# Patient Record
Sex: Female | Born: 1985 | Race: White | Hispanic: No | Marital: Single | Smoking: Never smoker
Health system: Southern US, Community
[De-identification: ages and names within clinical notes are randomized; demographics above are authoritative.]

---

## 2019-04-06 ENCOUNTER — Other Ambulatory Visit: Payer: Self-pay

## 2019-04-06 DIAGNOSIS — Z20822 Contact with and (suspected) exposure to covid-19: Secondary | ICD-10-CM

## 2019-04-07 LAB — NOVEL CORONAVIRUS, NAA: SARS-CoV-2, NAA: NOT DETECTED

## 2019-04-08 ENCOUNTER — Telehealth: Payer: Self-pay | Admitting: General Practice

## 2019-04-08 NOTE — Telephone Encounter (Signed)
Negative COVID results given. Patient results "NOT Detected." Caller expressed understanding. ° °

## 2019-04-20 ENCOUNTER — Other Ambulatory Visit: Payer: Self-pay

## 2019-04-20 DIAGNOSIS — Z20822 Contact with and (suspected) exposure to covid-19: Secondary | ICD-10-CM

## 2019-04-21 LAB — NOVEL CORONAVIRUS, NAA: SARS-CoV-2, NAA: NOT DETECTED

## 2019-04-22 ENCOUNTER — Telehealth: Payer: Self-pay | Admitting: General Practice

## 2019-04-22 NOTE — Telephone Encounter (Signed)
Negative COVID results given. Patient results "NOT Detected." Caller expressed understanding. ° °

## 2019-08-09 ENCOUNTER — Ambulatory Visit: Payer: Self-pay | Attending: Internal Medicine

## 2019-08-09 DIAGNOSIS — Z20822 Contact with and (suspected) exposure to covid-19: Secondary | ICD-10-CM

## 2019-08-11 LAB — NOVEL CORONAVIRUS, NAA: SARS-CoV-2, NAA: NOT DETECTED

## 2019-08-12 ENCOUNTER — Telehealth: Payer: Self-pay | Admitting: General Practice

## 2019-08-12 NOTE — Telephone Encounter (Signed)
Patient is calling for her negative COVID test results. Patient expressed understanding. °

## 2019-10-11 ENCOUNTER — Other Ambulatory Visit: Payer: Self-pay

## 2019-10-11 ENCOUNTER — Emergency Department: Payer: Self-pay

## 2019-10-11 ENCOUNTER — Emergency Department
Admission: EM | Admit: 2019-10-11 | Discharge: 2019-10-11 | Disposition: A | Discharge status: Home / Self Care | Payer: Self-pay | Attending: Emergency Medicine | Admitting: Emergency Medicine

## 2019-10-11 DIAGNOSIS — M7918 Myalgia, other site: Secondary | ICD-10-CM | POA: Insufficient documentation

## 2019-10-11 DIAGNOSIS — B349 Viral infection, unspecified: Secondary | ICD-10-CM | POA: Insufficient documentation

## 2019-10-11 DIAGNOSIS — H9203 Otalgia, bilateral: Secondary | ICD-10-CM | POA: Insufficient documentation

## 2019-10-11 DIAGNOSIS — R5383 Other fatigue: Secondary | ICD-10-CM | POA: Insufficient documentation

## 2019-10-11 DIAGNOSIS — R0981 Nasal congestion: Secondary | ICD-10-CM | POA: Insufficient documentation

## 2019-10-11 DIAGNOSIS — R1110 Vomiting, unspecified: Secondary | ICD-10-CM | POA: Insufficient documentation

## 2019-10-11 DIAGNOSIS — Z20822 Contact with and (suspected) exposure to covid-19: Secondary | ICD-10-CM | POA: Insufficient documentation

## 2019-10-11 DIAGNOSIS — R197 Diarrhea, unspecified: Secondary | ICD-10-CM | POA: Insufficient documentation

## 2019-10-11 LAB — SARS CORONAVIRUS 2 (TAT 6-24 HRS): SARS Coronavirus 2: NEGATIVE

## 2019-10-11 LAB — GROUP A STREP BY PCR: Group A Strep by PCR: NOT DETECTED

## 2019-10-11 MED ORDER — ONDANSETRON HCL 8 MG PO TABS: 8.0000 mg | ORAL_TABLET | Freq: Three times a day (TID) | ORAL | 0 refills | Status: DC | PRN

## 2019-10-11 MED ORDER — BENZONATATE 100 MG PO CAPS: 200.0000 mg | ORAL_CAPSULE | Freq: Three times a day (TID) | ORAL | 0 refills | Status: DC | PRN

## 2019-10-11 MED ORDER — DIPHENOXYLATE-ATROPINE 2.5-0.025 MG PO TABS
2.0000 | ORAL_TABLET | Freq: Once | ORAL | Status: AC
  Administered 2019-10-11: 2 via ORAL
  Filled 2019-10-11: qty 2

## 2019-10-11 MED ORDER — LOPERAMIDE HCL 2 MG PO TABS: 2.0000 mg | ORAL_TABLET | Freq: Four times a day (QID) | ORAL | 0 refills | Status: DC | PRN

## 2019-10-11 MED ORDER — ONDANSETRON 8 MG PO TBDP
8.0000 mg | ORAL_TABLET | Freq: Once | ORAL | Status: AC
  Administered 2019-10-11: 8 mg via ORAL
  Filled 2019-10-11: qty 2

## 2019-10-11 MED ORDER — IBUPROFEN 600 MG PO TABS: 600.0000 mg | ORAL_TABLET | Freq: Three times a day (TID) | ORAL | 0 refills | Status: DC | PRN

## 2019-10-11 MED ORDER — FEXOFENADINE-PSEUDOEPHED ER 60-120 MG PO TB12: 1.0000 | ORAL_TABLET | Freq: Two times a day (BID) | ORAL | 0 refills | Status: DC

## 2019-10-11 NOTE — Discharge Instructions (Signed)
Follow discharge care instruction take medication as directed.  Advised self quarantine pending results of COVID-19 test. 

## 2019-10-11 NOTE — ED Notes (Signed)
See triage note  Presents with 2-3 day hx of cough,body aches and n/v/d  Denies any fever  Last time having any diarrhea or vomiting was this am   Also has sore throat

## 2019-10-11 NOTE — ED Provider Notes (Signed)
[facility] Emergency Department Provider Note   ____________________________________________   First MD Initiated Contact with Patient 10/11/19 [phone]     (approximate)  I have reviewed the triage vital signs and the nursing notes.   HISTORY  Chief Complaint URI    HPI Amy Steele is a 34 y.o. female patient presents with 3 days of cough, nasal congestion, earache, sore throat, vomiting, and diarrhea.  Patient also complained of body aches and fatigue.  Patient denies recent travel or known contact with COVID-19.  Patient has tested negative for COVID-19 3 times in the past 6 months.      History reviewed. No pertinent past medical history.  There are no problems to display for this patient.   History reviewed. No pertinent surgical history.  Prior to Admission medications   Medication Sig Start Date End Date Taking? Authorizing Provider  benzonatate (TESSALON PERLES) 100 MG capsule Take 2 capsules (200 mg total) by mouth 3 (three) times daily as needed. 10/11/19 10/10/20  Joni Reining, PA-C  fexofenadine-pseudoephedrine (ALLEGRA-D) 60-120 MG 12 hr tablet Take 1 tablet by mouth 2 (two) times daily. 10/11/19   Joni Reining, PA-C  ibuprofen (ADVIL) 600 MG tablet Take 1 tablet (600 mg total) by mouth every 8 (eight) hours as needed. 10/11/19   Joni Reining, PA-C  loperamide (IMODIUM A-D) 2 MG tablet Take 1 tablet (2 mg total) by mouth 4 (four) times daily as needed for diarrhea or loose stools. 10/11/19   Joni Reining, PA-C  ondansetron (ZOFRAN) 8 MG tablet Take 1 tablet (8 mg total) by mouth every 8 (eight) hours as needed for nausea or vomiting. 10/11/19   Joni Reining, PA-C    Allergies Patient has no known allergies.  No family history on file.  Social History Social History   Tobacco Use  . Smoking status: Never Smoker  . Smokeless tobacco: Never Used  Substance Use Topics  . Alcohol use: Not Currently  . Drug use: Not Currently     Review of Systems Constitutional: No fever/chills.  Body ache and fatigue. Eyes: No visual changes. ENT: Sore throat, nasal congestion, bilateral ear pressure/pain. Cardiovascular: Denies chest pain.  Nonproductive cough. Respiratory: Denies shortness of breath. Gastrointestinal: No abdominal pain.  Nausea, vomiting, diarrhea.  Genitourinary: Negative for dysuria. Musculoskeletal: Negative for back pain. Skin: Negative for rash. Neurological: Negative for headaches, focal weakness or numbness.  ____________________________________________   PHYSICAL EXAM:  VITAL SIGNS: ED Triage Vitals  Enc Vitals Group     BP 10/11/19 0933 (!) 149/88     Pulse Rate 10/11/19 0933 88     Resp 10/11/19 0933 17     Temp 10/11/19 0933 (!) 97.5 F (36.4 C)     Temp Source 10/11/19 0933 Oral     SpO2 10/11/19 0933 98 %     Weight 10/11/19 0930 (!) 303 lb (137.4 kg)     Height 10/11/19 0930 5\' 4"  (1.626 m)     Head Circumference --      Peak Flow --      Pain Score 10/11/19 0930 5     Pain Loc --      Pain Edu? --      Excl. in GC? --    Constitutional: Alert and oriented. Well appearing and in no acute distress. Nose: Tenderness nasal turbinates clear rhinorrhea. Mouth/Throat: Mucous membranes are moist.  Oropharynx non-erythematous.  Postnasal drainage. Neck: No stridor.  Hematological/Lymphatic/Immunilogical: No cervical lymphadenopathy. Cardiovascular: Normal  rate, regular rhythm. Grossly normal heart sounds.  Good peripheral circulation. Respiratory: Normal respiratory effort.  No retractions. Lungs CTAB. Gastrointestinal: Soft and nontender. No distention. No abdominal bruits. No CVA tenderness. Genitourinary: Deferred Musculoskeletal: No lower extremity tenderness nor edema.  No joint effusions. Neurologic:  Normal speech and language. No gross focal neurologic deficits are appreciated. No gait instability. Skin:  Skin is warm, dry and intact. No rash noted. Psychiatric: Mood and  affect are normal. Speech and behavior are normal.  ____________________________________________   LABS (all labs ordered are listed, but only abnormal results are displayed)  Labs Reviewed  GROUP A STREP BY PCR  SARS CORONAVIRUS 2 (TAT 6-24 HRS)   ____________________________________________  EKG   ____________________________________________  RADIOLOGY  ED MD interpretation:    Official radiology report(s): DG Chest Portable 1 View  Result Date: 10/11/2019 CLINICAL DATA:  Cough and congestion for 3 days EXAM: PORTABLE CHEST 1 VIEW COMPARISON:  None. FINDINGS: The heart size and mediastinal contours are within normal limits. Both lungs are clear. The visualized skeletal structures are unremarkable. IMPRESSION: No active disease. Electronically Signed   By: Randa Ngo M.D.   On: 10/11/2019 10:32    ____________________________________________   PROCEDURES  Procedure(s) performed (including Critical Care):  Procedures   ____________________________________________   INITIAL IMPRESSION / ASSESSMENT AND PLAN / ED COURSE  As part of my medical decision making, I reviewed the following data within the [facility]   Patient presents with 3 days of cough, nasal congestion, bilateral ear pain, sore throat, body aches, and fatigue.  Discussed negative strep results with patient.  Patient physical exam and complaint consistent with viral illness.  Patient given discharge care instruction advised self quarantine pending results of COVID-19 test.  Take medication as directed.  Advised [facility] with [facility].       Amy Steele was evaluated in Emergency Department on 10/11/2019 for the symptoms described in the history of present illness. She was evaluated in the context of the global COVID-19 pandemic, which necessitated consideration that the patient might be at risk for infection with the SARS-CoV-2 virus that causes COVID-19. Institutional  protocols and algorithms that pertain to the evaluation of patients at risk for COVID-19 are in a state of rapid change based on information released by regulatory bodies including the CDC and federal and state organizations. These policies and algorithms were followed during the patient's care in the ED.       ____________________________________________   FINAL CLINICAL IMPRESSION(S) / ED DIAGNOSES  Final diagnoses:  Viral illness     ED Discharge Orders         Ordered    fexofenadine-pseudoephedrine (ALLEGRA-D) 60-120 MG 12 hr tablet  2 times daily     10/11/19 1137    benzonatate (TESSALON PERLES) 100 MG capsule  3 times daily PRN     10/11/19 1137    ibuprofen (ADVIL) 600 MG tablet  Every 8 hours PRN     10/11/19 1137    ondansetron (ZOFRAN) 8 MG tablet  Every 8 hours PRN     10/11/19 1137    loperamide (IMODIUM A-D) 2 MG tablet  4 times daily PRN     10/11/19 1137           Note:  This document was prepared using Dragon voice recognition software and may include unintentional dictation errors.    Sable Feil, PA-C 10/11/19 1142    Lavonia Drafts, MD 10/11/19 1425

## 2019-10-11 NOTE — ED Triage Notes (Signed)
Pt c/o cough, congestion ear ache with sore throat since Sunday. Pt is in NAD,

## 2019-10-27 ENCOUNTER — Emergency Department
Admission: EM | Admit: 2019-10-27 | Discharge: 2019-10-27 | Disposition: A | Discharge status: Home / Self Care | Payer: Self-pay | Attending: Emergency Medicine | Admitting: Emergency Medicine

## 2019-10-27 ENCOUNTER — Other Ambulatory Visit: Payer: Self-pay

## 2019-10-27 DIAGNOSIS — J069 Acute upper respiratory infection, unspecified: Secondary | ICD-10-CM | POA: Insufficient documentation

## 2019-10-27 MED ORDER — PSEUDOEPH-BROMPHEN-DM 30-2-10 MG/5ML PO SYRP: 5.0000 mL | ORAL_SOLUTION | Freq: Four times a day (QID) | ORAL | 0 refills | Status: AC | PRN

## 2019-10-27 NOTE — ED Triage Notes (Signed)
Pt c/o cough with sore throat and congestion for the past week. Pt is in NAD at present. Pt ambulatory with a steady gait

## 2019-10-27 NOTE — ED Notes (Signed)
See triage note states she was seen 2 weeks ago with similar sxs'  Had negative COVID test at that time   States she is now having scratchy throat and right ear pain with some congestion fro about 1 week  States she has been having low grade temp at home but is afebrile on arrival to ED

## 2019-10-27 NOTE — Discharge Instructions (Signed)
Follow-up with your primary care provider if any continued problems or concerns.  Begin taking Bromfed DM as needed for congestion, scratchy throat, ear pain.  You may also take Tylenol or ibuprofen with this medication as they will not interfere with each other.  Drink lots of fluids with this medication.

## 2019-10-27 NOTE — ED Provider Notes (Signed)
[facility] Emergency Department Provider Note   ____________________________________________   First MD Initiated Contact with Patient 10/27/19 1358     (approximate)  I have reviewed the triage vital signs and the nursing notes.   HISTORY  Chief Complaint URI   HPI Amy Steele is a 34 y.o. female presents to the ED with complaint of scratchy throat, right ear pain, nasal congestion for 1 week.  Patient denies any known exposure to Covid and has had subjective "low-grade temp".  Patient denies any loss of taste or smell.  Patient was seen approximately 2 weeks ago and had a Covid test done in the ED at that time which was negative.  She has been taking over-the-counter medications but none which would have helped with nasal congestion or posterior drainage.  She rates her pain as 5 out of 10.     History reviewed. No pertinent past medical history.  There are no problems to display for this patient.   History reviewed. No pertinent surgical history.  Prior to Admission medications   Medication Sig Start Date End Date Taking? Authorizing Provider  brompheniramine-pseudoephedrine-DM 30-2-10 MG/5ML syrup Take 5 mLs by mouth 4 (four) times daily as needed. 10/27/19   Tommi Rumps, PA-C    Allergies Patient has no known allergies.  No family history on file.  Social History Social History   Tobacco Use  . Smoking status: Never Smoker  . Smokeless tobacco: Never Used  Substance Use Topics  . Alcohol use: Not Currently  . Drug use: Not Currently    Review of Systems Constitutional: No fever/chills Eyes: No visual changes. ENT: Positive nasal congestion, positive scratchy throat and right ear pain. Cardiovascular: Denies chest pain. Respiratory: Denies shortness of breath. Gastrointestinal: No abdominal pain.  No nausea, no vomiting.  No diarrhea.  Genitourinary: Negative for dysuria. Musculoskeletal: Negative for back pain. Skin:  Negative for rash. Neurological: Negative for headaches, focal weakness or numbness. ____________________________________________   PHYSICAL EXAM:  VITAL SIGNS: ED Triage Vitals  Enc Vitals Group     BP 10/27/19 1310 (!) 146/95     Pulse Rate 10/27/19 1310 92     Resp 10/27/19 1310 18     Temp 10/27/19 1310 98.2 F (36.8 C)     Temp Source 10/27/19 1310 Oral     SpO2 10/27/19 1310 98 %     Weight 10/27/19 1308 (!) 303 lb (137.4 kg)     Height 10/27/19 1308 5\' 4"  (1.626 m)     Head Circumference --      Peak Flow --      Pain Score 10/27/19 1308 5     Pain Loc --      Pain Edu? --      Excl. in GC? --    Constitutional: Alert and oriented. Well appearing and in no acute distress. Eyes: Conjunctivae are normal.  Head: Atraumatic. Nose: Mild congestion/rhinnorhea.  EACs and TMs clear bilaterally. Mouth/Throat: Mucous membranes are moist.  Oropharynx non-erythematous.  Mild posterior drainage noted but no exudate or erythema noted. Neck: No stridor.   Hematological/Lymphatic/Immunilogical: No cervical lymphadenopathy. Cardiovascular: Normal rate, regular rhythm. Grossly normal heart sounds.  Good peripheral circulation. Respiratory: Normal respiratory effort.  No retractions. Lungs CTAB. Musculoskeletal: Moves upper and lower extremities they have difficulty normal gait was noted. Neurologic:  Normal speech and language. No gross focal neurologic deficits are appreciated. No gait instability. Skin:  Skin is warm, dry and intact. No rash noted. Psychiatric: Mood  and affect are normal. Speech and behavior are normal.  ____________________________________________   LABS (all labs ordered are listed, but only abnormal results are displayed)  Labs Reviewed - No data to display ____________________________________________  PROCEDURES  Procedure(s) performed (including Critical Care):  Procedures   ____________________________________________   INITIAL IMPRESSION /  ASSESSMENT AND PLAN / ED COURSE  As part of my medical decision making, I reviewed the following data within the electronic MEDICAL RECORD NUMBER Notes from prior ED visits and [place] Controlled Substance Database  34 year old female presents to the ED with complaint of nasal congestion, scratchy throat and right ear pain for approximately 1 week.  She also complains of a subjective fever but is afebrile in the ED and O2 sat is 98%.  She was tested 2 weeks ago and Covid test was negative.  She states that the symptoms she has today are similar to the symptoms that she was seen before.  She has been taking over-the-counter medication but nothing for the congestion.  A prescription for Bromfed was sent to her pharmacy and patient was encouraged to drink lots of fluids.  ____________________________________________   FINAL CLINICAL IMPRESSION(S) / ED DIAGNOSES  Final diagnoses:  Upper respiratory tract infection, unspecified type     ED Discharge Orders         Ordered    brompheniramine-pseudoephedrine-DM 30-2-10 MG/5ML syrup  4 times daily PRN     10/27/19 1410           Note:  This document was prepared using Dragon voice recognition software and may include unintentional dictation errors.    Johnn Hai, PA-C 10/27/19 1411    Duffy Bruce, MD 10/28/19 1004

## 2020-02-15 ENCOUNTER — Telehealth: Payer: Self-pay | Admitting: General Practice

## 2020-02-15 NOTE — Telephone Encounter (Signed)
Individual has been contacted 3+ times regarding ED referral. No further attempts to contact individual will be made. 

## 2020-10-09 ENCOUNTER — Other Ambulatory Visit: Payer: Self-pay

## 2020-10-09 ENCOUNTER — Emergency Department
Admission: EM | Admit: 2020-10-09 | Discharge: 2020-10-09 | Disposition: A | Discharge status: Home / Self Care | Payer: Self-pay | Attending: Emergency Medicine | Admitting: Emergency Medicine

## 2020-10-09 ENCOUNTER — Emergency Department: Payer: Self-pay

## 2020-10-09 DIAGNOSIS — R0602 Shortness of breath: Secondary | ICD-10-CM | POA: Insufficient documentation

## 2020-10-09 DIAGNOSIS — B349 Viral infection, unspecified: Secondary | ICD-10-CM | POA: Insufficient documentation

## 2020-10-09 DIAGNOSIS — J209 Acute bronchitis, unspecified: Secondary | ICD-10-CM | POA: Insufficient documentation

## 2020-10-09 LAB — COMPREHENSIVE METABOLIC PANEL
ALT: 13 U/L (ref 0–44)
AST: 16 U/L (ref 15–41)
Albumin: 3.7 g/dL (ref 3.5–5.0)
Alkaline Phosphatase: 63 U/L (ref 38–126)
Anion gap: 7 (ref 5–15)
BUN: 12 mg/dL (ref 6–20)
CO2: 24 mmol/L (ref 22–32)
Calcium: 8.5 mg/dL — ABNORMAL LOW (ref 8.9–10.3)
Chloride: 107 mmol/L (ref 98–111)
Creatinine, Ser: 0.92 mg/dL (ref 0.44–1.00)
GFR, Estimated: >60 mL/min (ref >60)
Glucose, Bld: 142 mg/dL — ABNORMAL HIGH (ref 70–99)
Potassium: 4.4 mmol/L (ref 3.5–5.1)
Sodium: 138 mmol/L (ref 135–145)
Total Bilirubin: 0.5 mg/dL (ref 0.3–1.2)
Total Protein: 7.9 g/dL (ref 6.5–8.1)

## 2020-10-09 LAB — URINALYSIS, COMPLETE (UACMP) WITH MICROSCOPIC
Bacteria, UA: NONE SEEN
Bilirubin Urine: NEGATIVE
Glucose, UA: NEGATIVE mg/dL
Hgb urine dipstick: NEGATIVE
Ketones, ur: NEGATIVE mg/dL
Leukocytes,Ua: NEGATIVE
Nitrite: NEGATIVE
Protein, ur: NEGATIVE mg/dL
Specific Gravity, Urine: 1.017 (ref 1.005–1.030)
pH: 6 (ref 5.0–8.0)

## 2020-10-09 LAB — CBC
HCT: 36.8 % (ref 36.0–46.0)
Hemoglobin: 10.5 g/dL — ABNORMAL LOW (ref 12.0–15.0)
MCH: 23.4 pg — ABNORMAL LOW (ref 26.0–34.0)
MCHC: 28.5 g/dL — ABNORMAL LOW (ref 30.0–36.0)
MCV: 82.1 fL (ref 80.0–100.0)
Platelets: 285 10*3/uL (ref 150–400)
RBC: 4.48 MIL/uL (ref 3.87–5.11)
RDW: 15.7 % — ABNORMAL HIGH (ref 11.5–15.5)
WBC: 8.3 10*3/uL (ref 4.0–10.5)
nRBC: 0 % (ref 0.0–0.2)

## 2020-10-09 LAB — LIPASE, BLOOD: Lipase: 30 U/L (ref 11–51)

## 2020-10-09 LAB — PREGNANCY, URINE: Preg Test, Ur: NEGATIVE

## 2020-10-09 LAB — TROPONIN I (HIGH SENSITIVITY): Troponin I (High Sensitivity): 3 ng/L (ref <18)

## 2020-10-09 LAB — BRAIN NATRIURETIC PEPTIDE: B Natriuretic Peptide: 27.6 pg/mL (ref 0.0–100.0)

## 2020-10-09 MED ORDER — ALBUTEROL SULFATE HFA 108 (90 BASE) MCG/ACT IN AERS: 2.0000 | INHALATION_SPRAY | Freq: Four times a day (QID) | RESPIRATORY_TRACT | 2 refills | Status: AC | PRN

## 2020-10-09 MED ORDER — PREDNISONE 20 MG PO TABS
40.0000 mg | ORAL_TABLET | Freq: Once | ORAL | Status: AC
  Administered 2020-10-09: 40 mg via ORAL
  Filled 2020-10-09: qty 2

## 2020-10-09 MED ORDER — IPRATROPIUM-ALBUTEROL 0.5-2.5 (3) MG/3ML IN SOLN
3.0000 mL | Freq: Once | RESPIRATORY_TRACT | Status: AC
  Administered 2020-10-09: 3 mL via RESPIRATORY_TRACT
  Filled 2020-10-09: qty 3

## 2020-10-09 MED ORDER — PREDNISONE 50 MG PO TABS: ORAL_TABLET | ORAL | 0 refills | Status: AC

## 2020-10-09 MED ORDER — ONDANSETRON 4 MG PO TBDP
4.0000 mg | ORAL_TABLET | Freq: Once | ORAL | Status: AC
  Administered 2020-10-09: 4 mg via ORAL
  Filled 2020-10-09: qty 1

## 2020-10-09 NOTE — ED Notes (Signed)
D/C and new RX discussed with and reasons to return to ED discussed with pt, pt verbalized understanding. NAD noted. VSS.

## 2020-10-09 NOTE — ED Provider Notes (Signed)
[facility] Emergency Department Provider Note   ____________________________________________   Event Date/Time   First MD Initiated Contact with Patient 10/09/20 [phone]     (approximate)  I have reviewed the triage vital signs and the nursing notes.   HISTORY  Chief Complaint Diarrhea, Emesis, and Shortness of Breath    HPI Sadiyah Delahoz is a 35 y.o. female past medical history of significant obesity  Patient comes today reports that for about 5 days she has been experiencing nausea vomiting diarrhea, this was after a possible Covid exposure.  She [facility] and [facility], there she had negative Covid and influenza testing "which I was able to verify via care everywhere)  She reports that the stomach symptoms of nausea vomiting diarrhea are much better and her stomach not hurting but the last 2 days she has had a little bit of a dry cough and some slight shortness of breath as well.  And she feels like her chest is feels a little congested.  She went to work today and was told she needed to be seen by Dr. Before she could return to work   No chest pain.  No ongoing vomiting or diarrhea.  Abdominal symptoms have resolved.  Discs patient denies pregnancy, reports that she is certain of this.  Was given a prescription of Zofran, as she reports she has not had to continue use of this because her nausea is largely resolved but her appetite is still somewhat low  History reviewed. No pertinent past medical history.  There are no problems to display for this patient.   History reviewed. No pertinent surgical history.  Prior to Admission medications   Medication Sig Start Date End Date Taking? Authorizing Provider  albuterol (VENTOLIN HFA) 108 (90 Base) MCG/ACT inhaler Inhale 2 puffs into the lungs every 6 (six) hours as needed for wheezing or shortness of breath. 10/09/20  Yes Sharyn Creamer, MD  predniSONE (DELTASONE) 50 MG tablet 1 tab by  mouth daily 10/09/20  Yes Sharyn Creamer, MD  brompheniramine-pseudoephedrine-DM 30-2-10 MG/5ML syrup Take 5 mLs by mouth 4 (four) times daily as needed. 10/27/19   Tommi Rumps, PA-C    Allergies Patient has no known allergies.  No family history on file.  Social History Social History   Tobacco Use  . Smoking status: Never Smoker  . Smokeless tobacco: Never Used  Substance Use Topics  . Alcohol use: Not Currently  . Drug use: Not Currently    Review of Systems Constitutional: No fever/chills Eyes: No visual changes. ENT: No sore throat. Cardiovascular: Denies chest pain. Respiratory: Slight shortness of breath, slight cough, feels congested in the chest. Gastrointestinal: No abdominal pain.  Nausea vomiting diarrhea largely resolved but still decreased appetite Genitourinary: Negative for dysuria.  Denies pregnancy Musculoskeletal: Negative for back pain. Skin: Negative for rash. Neurological: Negative for headaches, areas of focal weakness or numbness.    ____________________________________________   PHYSICAL EXAM:  VITAL SIGNS: ED Triage Vitals  Enc Vitals Group     BP 10/09/20 0719 (!) 133/92     Pulse Rate 10/09/20 0719 77     Resp 10/09/20 0719 18     Temp 10/09/20 0719 98.4 F (36.9 C)     Temp Source 10/09/20 0719 Oral     SpO2 10/09/20 0719 100 %     Weight 10/09/20 0720 (!) 320 lb (145.2 kg)     Height 10/09/20 0720 5\' 3"  (1.6 m)  Head Circumference --      Peak Flow --      Pain Score 10/09/20 0721 0     Pain Loc --      Pain Edu? --      Excl. in GC? --     Constitutional: Alert and oriented. Well appearing and in no acute distress.  Morbid obesity noted.  Patient is very pleasant. Eyes: Conjunctivae are normal. Head: Atraumatic. Nose: No congestion/rhinnorhea. Mouth/Throat: Mucous membranes are moist. Neck: No stridor.  Cardiovascular: Normal rate, regular rhythm. Grossly normal heart sounds.  Good peripheral circulation. Respiratory:  Normal respiratory effort.  No retractions. Lungs CTAB except for some very scant wheezing lower lobes bilateral but normal work of breathing speaks in full clear sentences. Gastrointestinal: Soft and nontender. No distention.  Morbid obesity. Musculoskeletal: No lower extremity tenderness nor edema. Neurologic:  Normal speech and language. No gross focal neurologic deficits are appreciated.  Skin:  Skin is warm, dry and intact. No rash noted. Psychiatric: Mood and affect are normal. Speech and behavior are normal.  ____________________________________________   LABS (all labs ordered are listed, but only abnormal results are displayed)  Labs Reviewed  COMPREHENSIVE METABOLIC PANEL - Abnormal; Notable for the following components:      Result Value   Glucose, Bld 142 (*)    Calcium 8.5 (*)    All other components within normal limits  CBC - Abnormal; Notable for the following components:   Hemoglobin 10.5 (*)    MCH 23.4 (*)    MCHC 28.5 (*)    RDW 15.7 (*)    All other components within normal limits  URINALYSIS, COMPLETE (UACMP) WITH MICROSCOPIC - Abnormal; Notable for the following components:   Color, Urine YELLOW (*)    APPearance CLEAR (*)    All other components within normal limits  LIPASE, BLOOD  BRAIN NATRIURETIC PEPTIDE  PREGNANCY, URINE  POC URINE PREG, ED  TROPONIN I (HIGH SENSITIVITY)   ____________________________________________  EKG  EKG is reviewed inter by me at 730 Heart rate 70 QRS 75 QTc 400 Normal sinus rhythm, no evidence of acute ischemia. ____________________________________________  RADIOLOGY  DG Chest 2 View  Result Date: 10/09/2020 CLINICAL DATA:  Shortness of breath. EXAM: CHEST - 2 VIEW COMPARISON:  08/28/2020. FINDINGS: Mediastinum and hilar structures normal. Heart size normal. Low lung volumes. No focal infiltrate. No pleural effusion or pneumothorax. IMPRESSION: No acute cardiopulmonary disease. Electronically Signed   By: Maisie Fus   Register   On: 10/09/2020 07:57    Chest x-ray viewed negative for acute ____________________________________________   PROCEDURES  Procedure(s) performed: None  Procedures  Critical Care performed: No  ____________________________________________   INITIAL IMPRESSION / ASSESSMENT AND PLAN / ED COURSE  Pertinent labs & imaging results that were available during my care of the patient were reviewed by me and considered in my medical decision making (see chart for details).   Patient presents for nausea vomiting a few days ago now having some cough and slight shortness of breath.  She does exhibit some very mild wheezing on exam.  She does not have a known history of pulmonary disease.  She does demonstrate morbid obesity.  She had negative Covid and influenza testing just a couple days ago and her GI type symptoms seem to have resolved.  She now complains of a cough.  I suspect likely bronchitis with bronchospasm.  Chest x-ray negative for pneumonia.  Her lab work very reassuring negative urinalysis.      Pulmonary Embolism Rule-out  Criteria (PERC rule)                        If YES to ANY of the following, the PERC rule is not satisfied and cannot be used to rule out PE in this patient (consider d-dimer or imaging depending on pre-test probability).                      If NO to ALL of the following, AND the clinician's pre-test probability is <15%, the [facility] rule is satisfied and there is no need for further workup (including no need to obtain a d-dimer) as the post-test probability of pulmonary embolism is <2%.                      Mnemonic is HAD CLOTS   H - hormone use (exogenous estrogen)      No. A - age > 50                                                 No. D - DVT/PE history                                      No.   C - coughing blood (hemoptysis)                 No. L - leg swelling, unilateral                             No. O - O2 Sat on Room Air < 95%                   No. T - tachycardia (HR ? 100)                         No. S - surgery or trauma, recent                      No.   Based on my evaluation of the patient, including application of this decision instrument, further testing to evaluate for pulmonary embolism is not indicated at this time.   Clinical Course as of 10/09/20 1302  Tue Oct 09, 2020  1258 PERC negative.  Patient resting comfortably, reassuring work-up to this point.  Patient feels ready go home.  Reassessment in no distress, feels better shortness of breath is improved.  I suspect likely bronchitis secondary to a viral etiology at this point.  Will discharge with prednisone and inhaler, patient in agreement with this as well as careful return precautions which we discussed. [MQ]    Clinical Course User Index [MQ] Sharyn Creamer, MD   ----------------------------------------- 1:02 PM on 10/09/2020 -----------------------------------------  Patient improved responded well to treatments.  ____________________________________________   FINAL CLINICAL IMPRESSION(S) / ED DIAGNOSES  Final diagnoses:  Viral syndrome  Acute bronchitis with bronchospasm        Note:  This document was prepared using Dragon voice recognition software and may include unintentional dictation errors       Sharyn Creamer, MD 10/09/20 1303

## 2020-10-09 NOTE — ED Triage Notes (Signed)
Pt c/o N/V/D since the weekend and today having SOB. States she went to [facility] Friday and was dx with a stomach virus.

## 2020-10-11 ENCOUNTER — Telehealth: Payer: Self-pay | Admitting: General Practice

## 2020-10-11 NOTE — Telephone Encounter (Signed)
Received an ER referral for pt. LVM. Please call back on Tuesday, March 15.   MD 10/11/20 @ 3:14

## 2021-08-29 ENCOUNTER — Ambulatory Visit: Payer: 59 | Admitting: Family Medicine

## 2021-12-11 IMAGING — CR DG CHEST 2V
1 series · 2 of 2 positions shown · non-contrast
Comparison: 08/28/2020.

CLINICAL DATA: Shortness of breath.

EXAM:
CHEST - 2 VIEW

[Series 1: dg chest 2 view · 0.14mm/px · 2 of 2 slices shown]
[im 1/2]
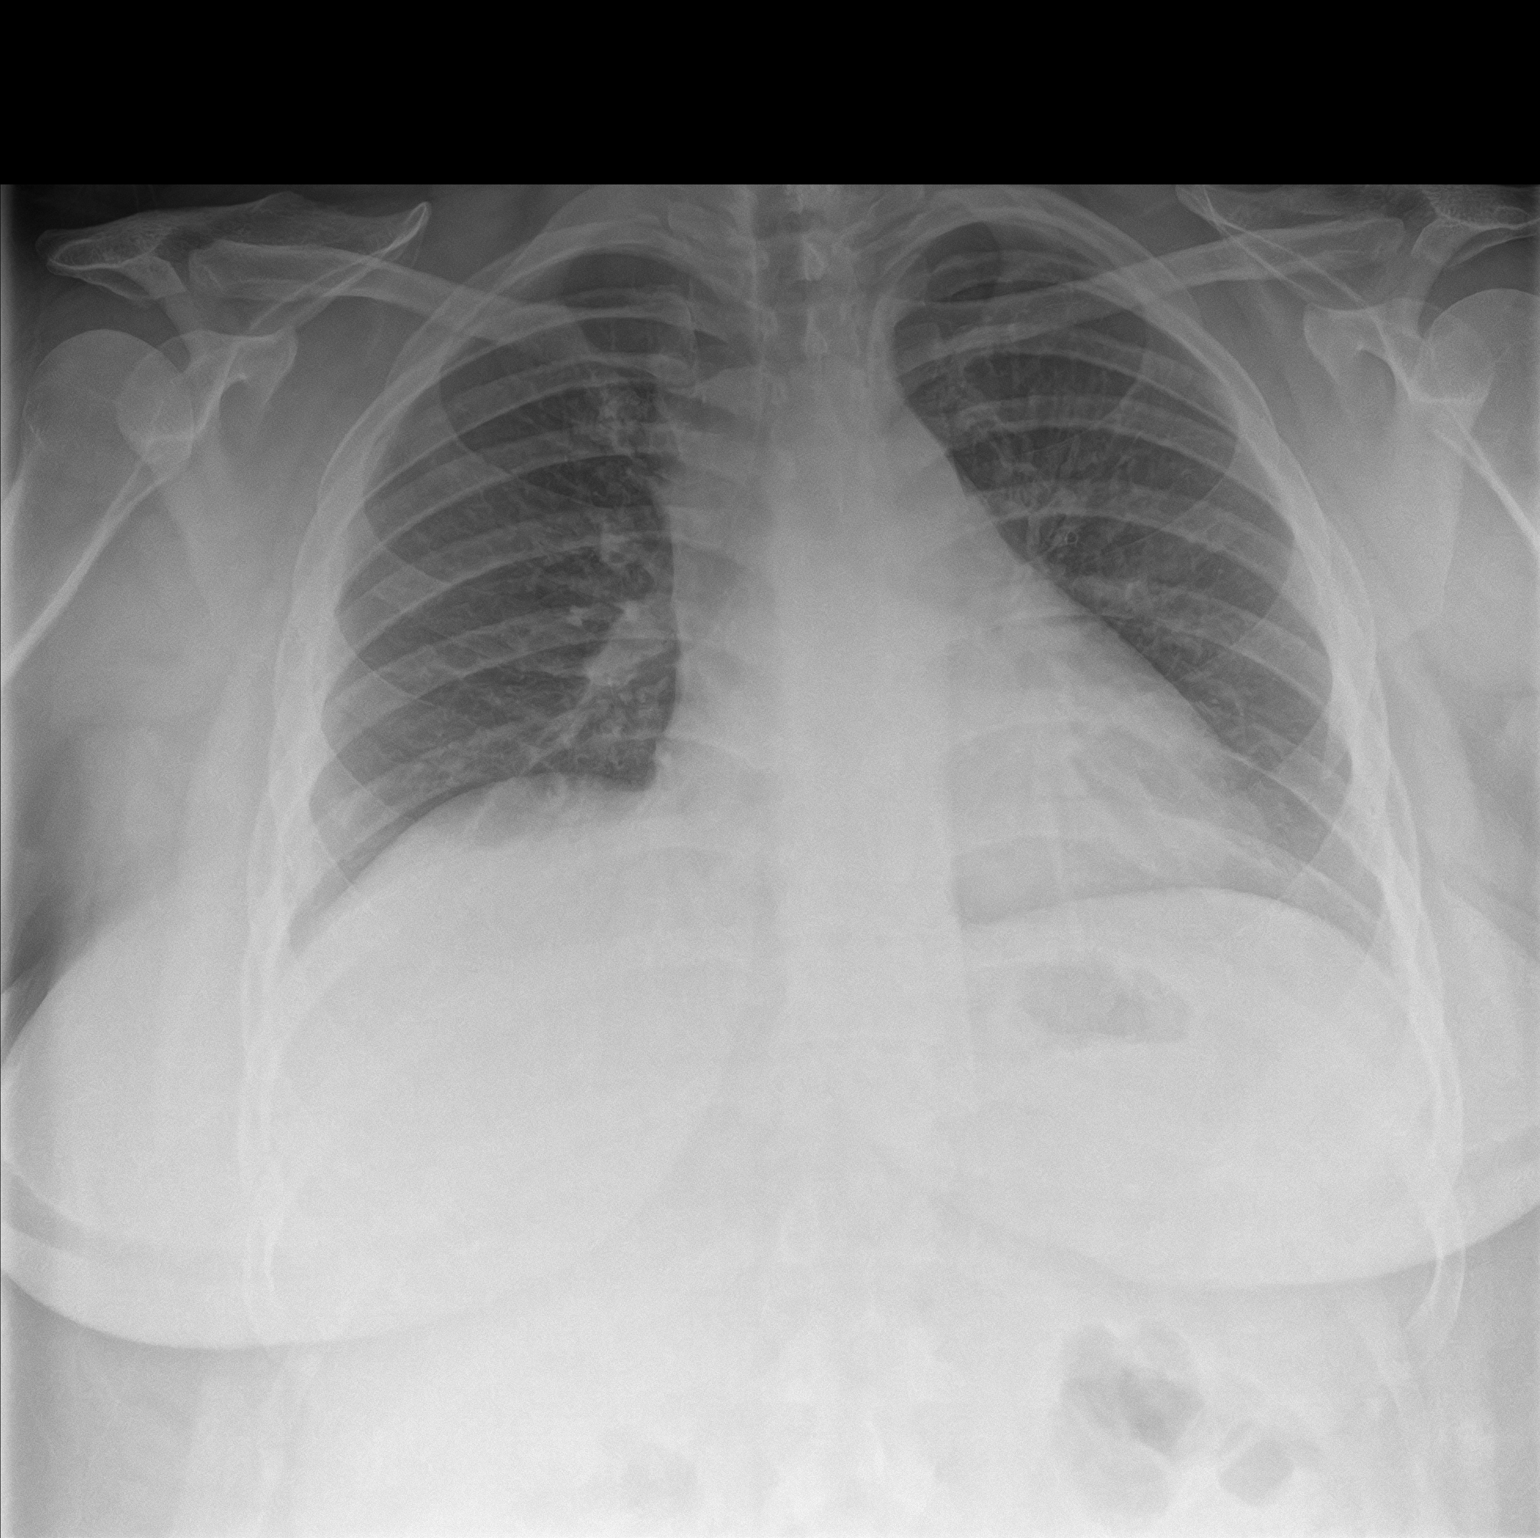
[im 2/2]
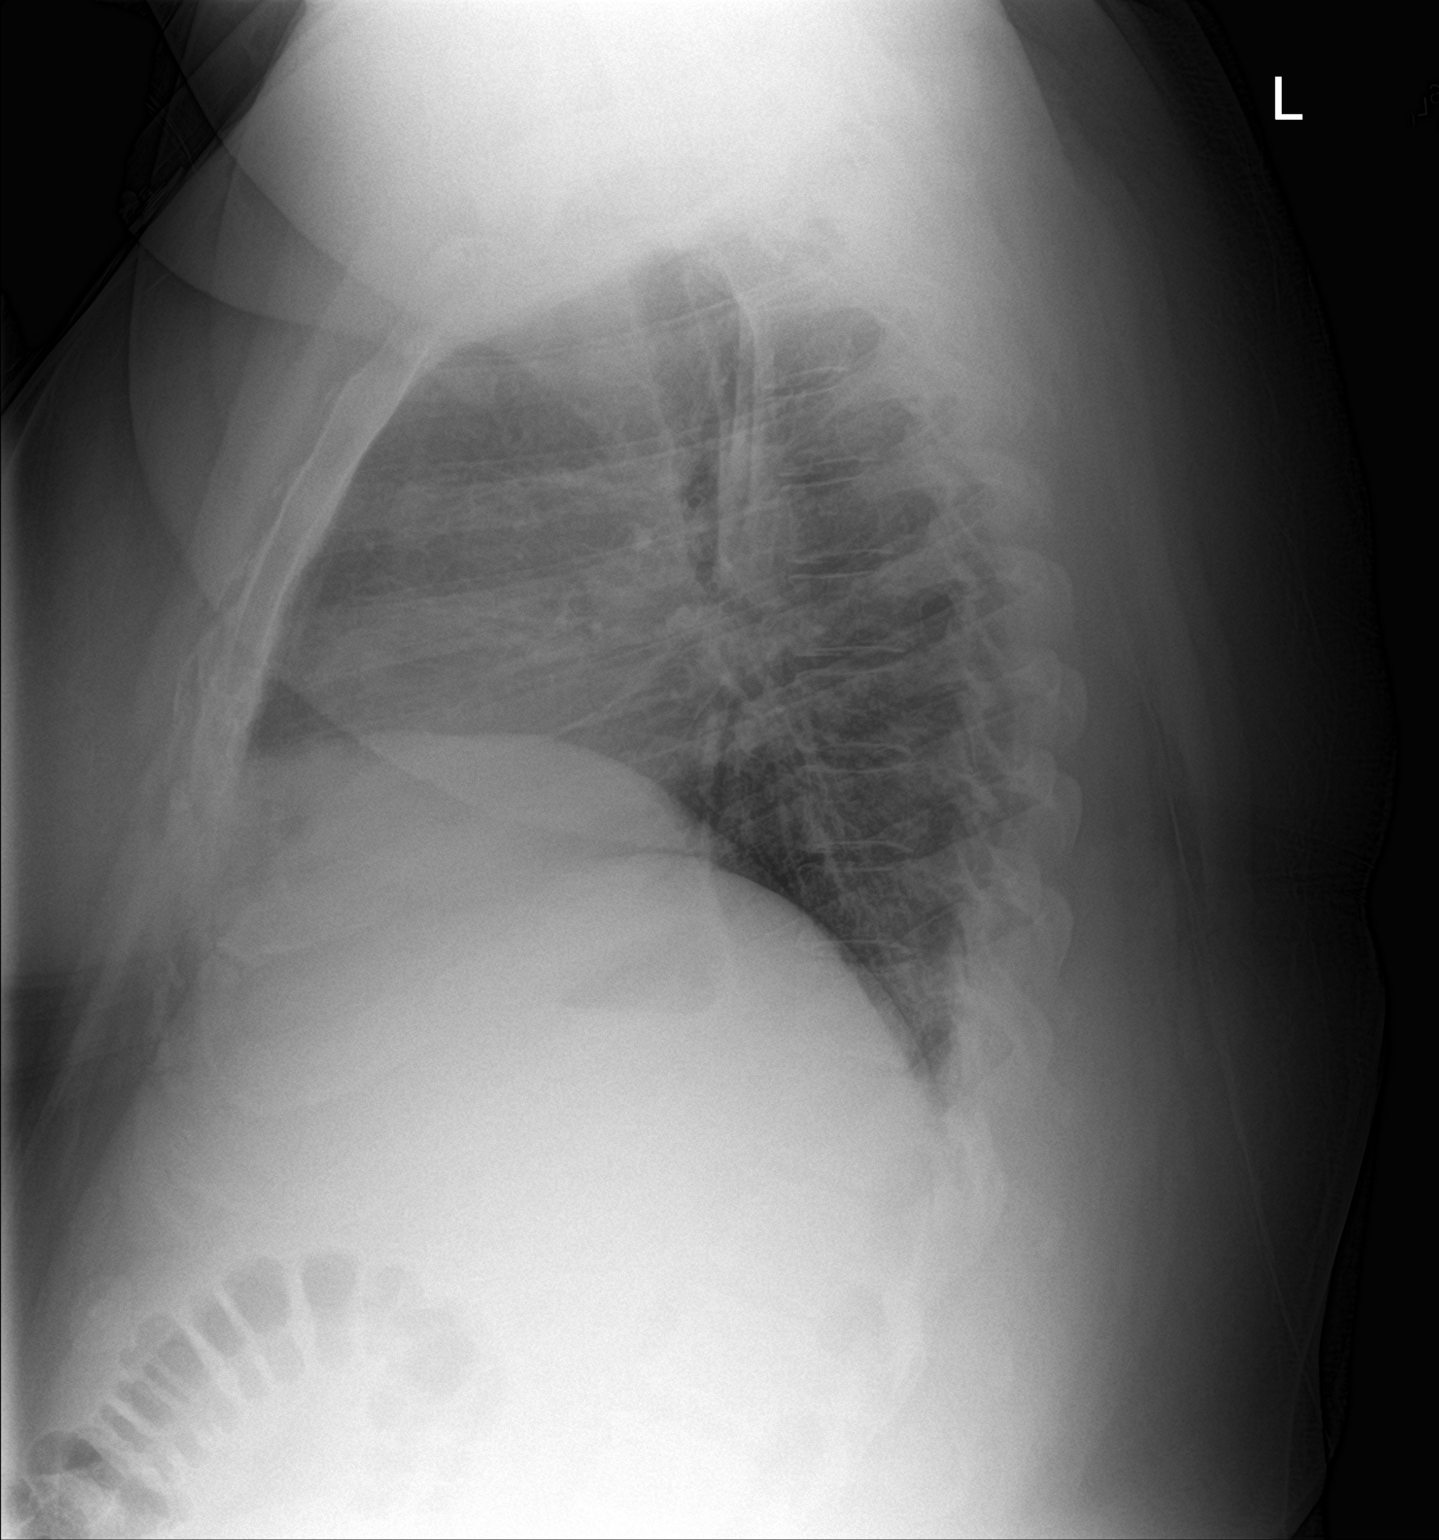

[2 of 2 positions shown; findings below may reference images not displayed]

FINDINGS: Mediastinum and hilar structures normal. Heart size normal. Low lung
volumes. No focal infiltrate. No pleural effusion or pneumothorax.
IMPRESSION: No acute cardiopulmonary disease.
